# Patient Record
Sex: Female | Born: 2005 | Race: White | Hispanic: No | Marital: Single | Smoking: Never smoker
Health system: Southern US, Community
[De-identification: ages and names within clinical notes are randomized; demographics above are authoritative.]

## PROBLEM LIST (undated history)

## (undated) DIAGNOSIS — H53009 Unspecified amblyopia, unspecified eye: Secondary | ICD-10-CM

## (undated) HISTORY — PX: TONSILLECTOMY: SUR1361

---
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
## 2023-12-13 IMAGING — CT CT HEAD W/O CM
4 series · 17 of 47 positions shown, 19 images · non-contrast
Comparison: None Available.

CLINICAL DATA: Altered mental status



[Series 2: head wo · axial · 0.42mm/px · z∈[+391,+511]mm · 7 of 33 slices shown, 9 images]
[im 5/33  brain]
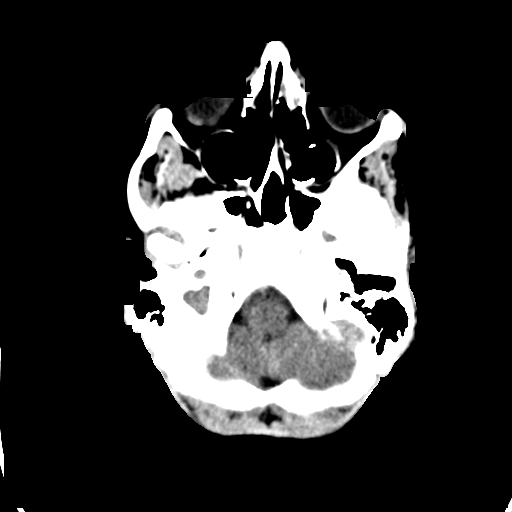
[im 5/33  bone]
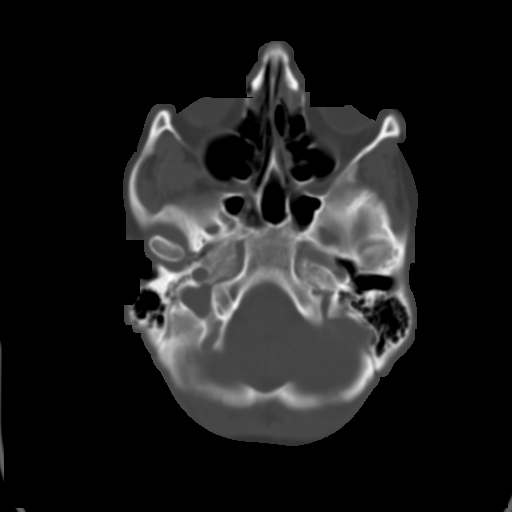
[im 9/33  brain]
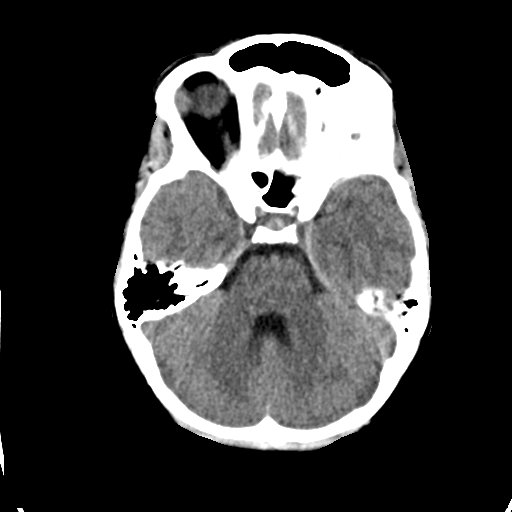
[im 13/33  brain]
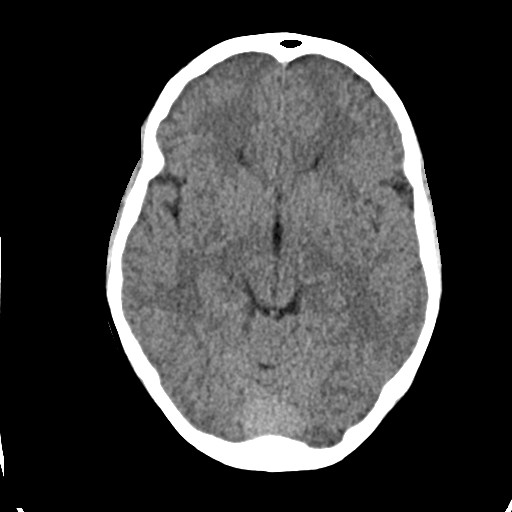
[im 17/33  brain]
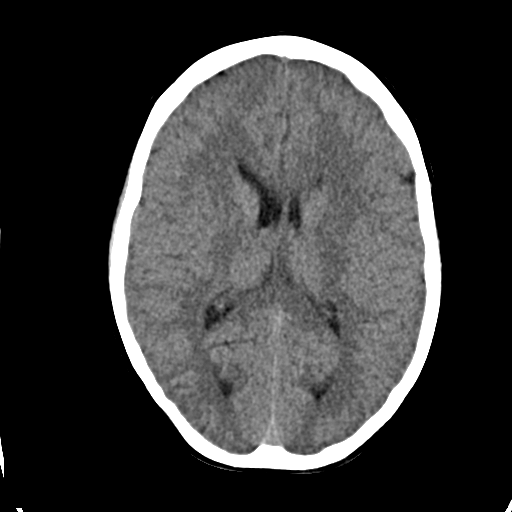
[im 21/33  brain]
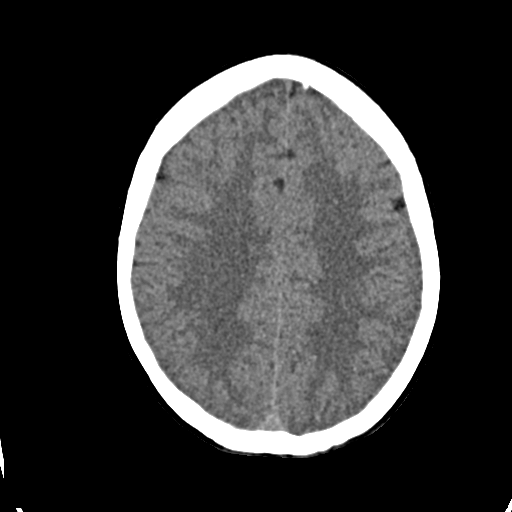
[im 21/33  bone]
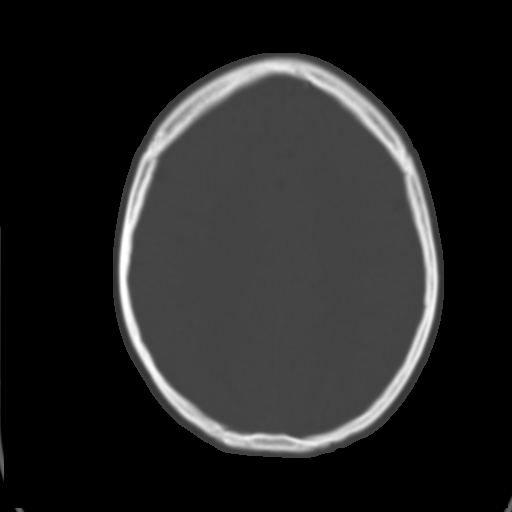
[im 25/33  brain]
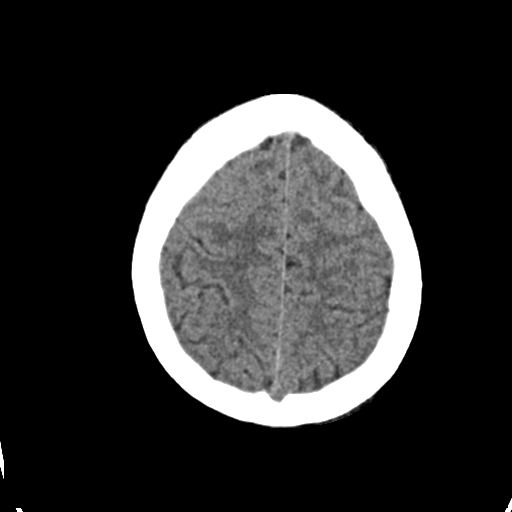
[im 29/33  brain]
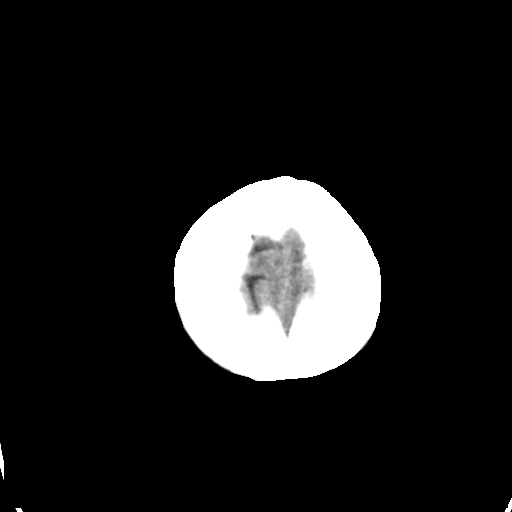

[Series 3: head bone · axial · 0.42mm/px · z∈[+387,+443]mm · 4 of 83 slices shown]
[im 9/83  bone]
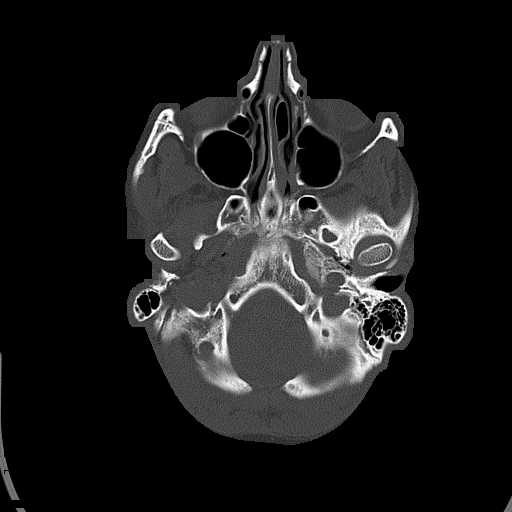
[im 17/83  bone]
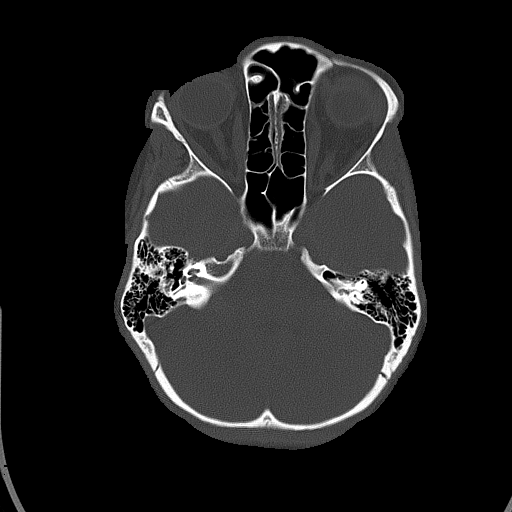
[im 25/83  bone]
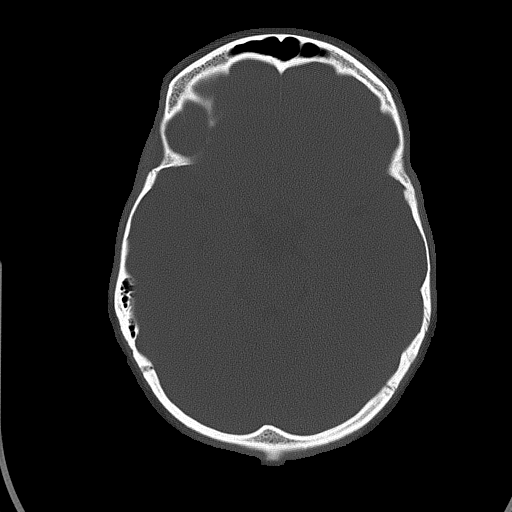
[im 37/83  bone]
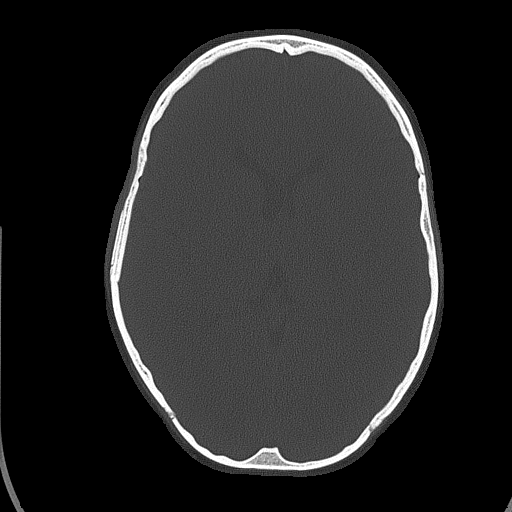

[Series 4: coronal soft · coronal · 0.34mm/px · 3 of 68 slices shown]
[im 23/68  brain]
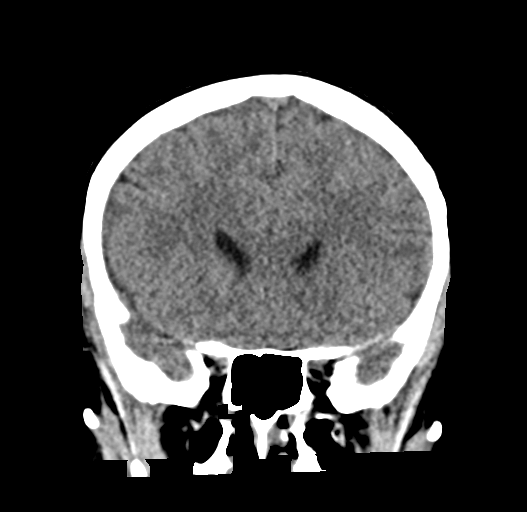
[im 30/68  brain]
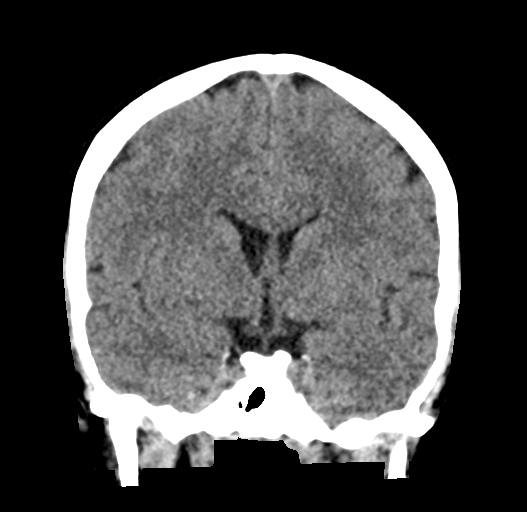
[im 38/68  brain]
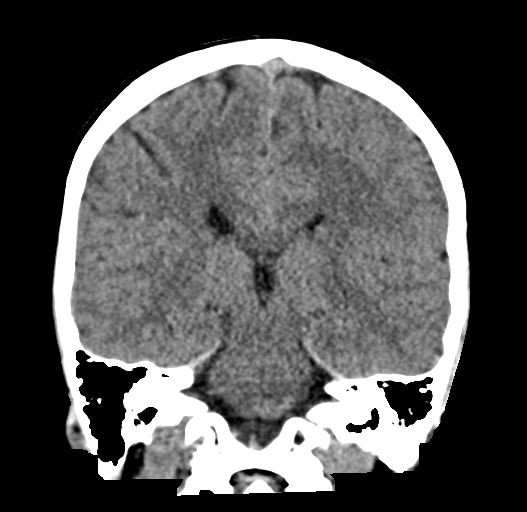

[Series 5: sag soft · sagittal · 0.34mm/px · 3 of 57 slices shown]
[im 19/57  brain]
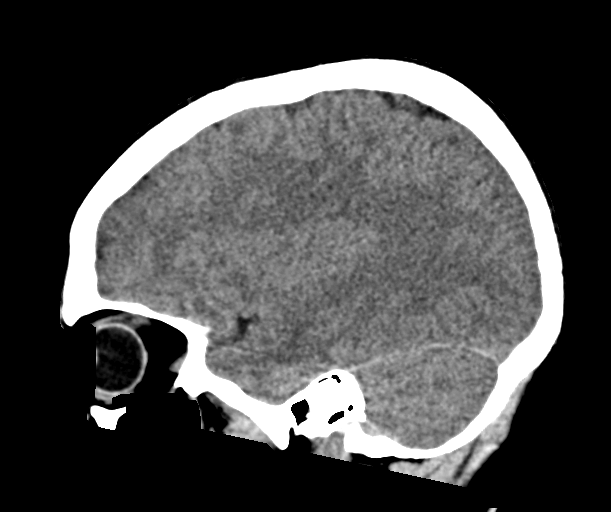
[im 29/57  brain]
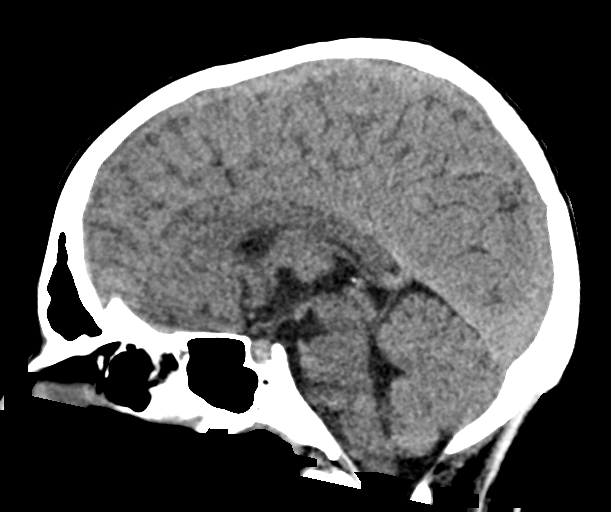
[im 38/57  brain]
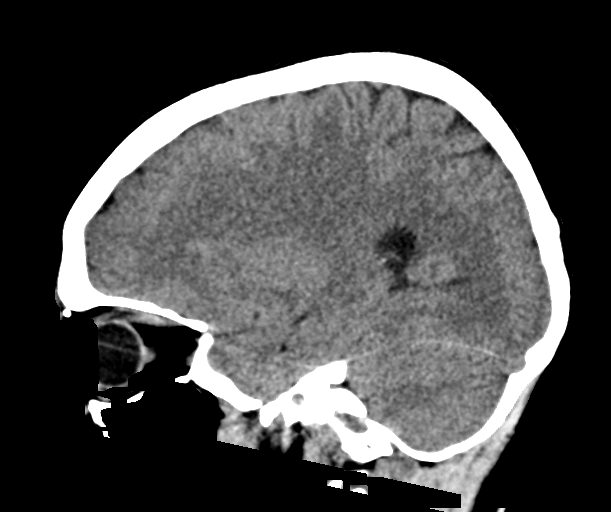

[17 of 47 positions shown; findings below may reference images not displayed]

FINDINGS: Brain: No evidence of acute infarction, hemorrhage, hydrocephalus,
extra-axial collection or mass lesion/mass effect.

Vascular: No hyperdense vessel or unexpected calcification.

Skull: Normal. Negative for fracture or focal lesion.

Sinuses/Orbits: The visualized paranasal sinuses are essentially
clear. The mastoid air cells are unopacified.

Other: None.
IMPRESSION: Normal head CT.
